# Patient Record
Sex: Female | Born: 1990 | Race: Black or African American | Hispanic: No | Marital: Single | Smoking: Never smoker
Health system: Southern US, Community
[De-identification: ages and names within clinical notes are randomized; demographics above are authoritative.]

## PROBLEM LIST (undated history)

## (undated) ENCOUNTER — Inpatient Hospital Stay (HOSPITAL_COMMUNITY): Payer: Self-pay

## (undated) HISTORY — PX: TONSILLECTOMY: SUR1361

---

## 2012-05-10 ENCOUNTER — Inpatient Hospital Stay (HOSPITAL_COMMUNITY)
Admission: AD | Admit: 2012-05-10 | Discharge: 2012-05-10 | Disposition: A | Discharge status: Home / Self Care | Payer: Medicaid - Out of State | Source: Ambulatory Visit | Attending: Obstetrics & Gynecology | Admitting: Obstetrics & Gynecology

## 2012-05-10 ENCOUNTER — Encounter (HOSPITAL_COMMUNITY): Payer: Self-pay

## 2012-05-10 DIAGNOSIS — R55 Syncope and collapse: Secondary | ICD-10-CM

## 2012-05-10 DIAGNOSIS — O2650 Maternal hypotension syndrome, unspecified trimester: Secondary | ICD-10-CM | POA: Insufficient documentation

## 2012-05-10 LAB — CBC
MCH: 26 pg (ref 26.0–34.0)
MCHC: 32.9 g/dL (ref 30.0–36.0)
RDW: 14 % (ref 11.5–15.5)

## 2012-05-10 LAB — COMPREHENSIVE METABOLIC PANEL
ALT: 9 U/L (ref 0–35)
AST: 24 U/L (ref 0–37)
Alkaline Phosphatase: 102 U/L (ref 39–117)
CO2: 23 mEq/L (ref 19–32)
Chloride: 102 mEq/L (ref 96–112)
GFR calc Af Amer: >90 mL/min (ref >90)
GFR calc non Af Amer: >90 mL/min (ref >90)
Glucose, Bld: 89 mg/dL (ref 70–99)
Potassium: 3.5 mEq/L (ref 3.5–5.1)
Sodium: 133 mEq/L — ABNORMAL LOW (ref 135–145)

## 2012-05-10 NOTE — MAU Provider Note (Signed)
Attestation of Attending Supervision of Advanced Practitioner (CNM/NP): Evaluation and management procedures were performed by the Advanced Practitioner under my supervision and collaboration.  I have reviewed the Advanced Practitioner's note and chart, and I agree with the management and plan.  HARRAWAY-SMITH, Tykel Badie 11:10 PM
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
## 2013-03-15 ENCOUNTER — Encounter (HOSPITAL_COMMUNITY): Payer: Self-pay | Admitting: *Deleted

## 2014-01-15 ENCOUNTER — Encounter (HOSPITAL_COMMUNITY): Payer: Self-pay | Admitting: *Deleted

## 2016-02-01 ENCOUNTER — Encounter (HOSPITAL_COMMUNITY): Payer: Self-pay | Admitting: Emergency Medicine

## 2016-02-01 DIAGNOSIS — S161XXA Strain of muscle, fascia and tendon at neck level, initial encounter: Secondary | ICD-10-CM | POA: Diagnosis not present

## 2016-02-01 DIAGNOSIS — Y939 Activity, unspecified: Secondary | ICD-10-CM | POA: Diagnosis not present

## 2016-02-01 DIAGNOSIS — Y92410 Unspecified street and highway as the place of occurrence of the external cause: Secondary | ICD-10-CM | POA: Diagnosis not present

## 2016-02-01 DIAGNOSIS — S199XXA Unspecified injury of neck, initial encounter: Secondary | ICD-10-CM | POA: Diagnosis present

## 2016-02-01 DIAGNOSIS — Y999 Unspecified external cause status: Secondary | ICD-10-CM | POA: Diagnosis not present
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
## 2016-02-02 ENCOUNTER — Emergency Department (HOSPITAL_COMMUNITY): Payer: No Typology Code available for payment source

## 2016-02-02 ENCOUNTER — Emergency Department (HOSPITAL_COMMUNITY)
Admission: EM | Admit: 2016-02-02 | Discharge: 2016-02-02 | Disposition: A | Discharge status: Home / Self Care | Payer: No Typology Code available for payment source | Attending: Emergency Medicine | Admitting: Emergency Medicine

## 2016-02-02 DIAGNOSIS — S161XXA Strain of muscle, fascia and tendon at neck level, initial encounter: Secondary | ICD-10-CM

## 2016-02-02 DIAGNOSIS — M542 Cervicalgia: Secondary | ICD-10-CM

## 2016-02-02 MED ORDER — IBUPROFEN 800 MG PO TABS
800.0000 mg | ORAL_TABLET | Freq: Once | ORAL | Status: AC
  Administered 2016-02-02: 800 mg via ORAL
  Filled 2016-02-02: qty 1

## 2016-02-02 MED ORDER — IBUPROFEN 800 MG PO TABS: 800.0000 mg | ORAL_TABLET | Freq: Three times a day (TID) | ORAL | 0 refills | Status: AC

## 2016-02-02 MED ORDER — METHOCARBAMOL 500 MG PO TABS: 500.0000 mg | ORAL_TABLET | Freq: Two times a day (BID) | ORAL | 0 refills | Status: AC

## 2016-02-02 NOTE — ED Notes (Signed)
Pt. decided to stay/wait and see the MD.
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
## 2016-02-02 NOTE — Discharge Instructions (Signed)

## 2016-02-02 NOTE — ED Notes (Signed)
Discharge instructions and prescriptions reviewed - voiced understanding 
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
## 2017-04-13 ENCOUNTER — Other Ambulatory Visit: Payer: Self-pay | Admitting: Radiology
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
## 2017-12-15 ENCOUNTER — Encounter (HOSPITAL_BASED_OUTPATIENT_CLINIC_OR_DEPARTMENT_OTHER): Payer: Self-pay

## 2017-12-15 ENCOUNTER — Emergency Department (HOSPITAL_BASED_OUTPATIENT_CLINIC_OR_DEPARTMENT_OTHER)
Admission: EM | Admit: 2017-12-15 | Discharge: 2017-12-15 | Disposition: A | Discharge status: Home / Self Care | Payer: BLUE CROSS/BLUE SHIELD | Attending: Emergency Medicine | Admitting: Emergency Medicine

## 2017-12-15 ENCOUNTER — Other Ambulatory Visit: Payer: Self-pay

## 2017-12-15 DIAGNOSIS — R200 Anesthesia of skin: Secondary | ICD-10-CM | POA: Diagnosis present

## 2017-12-15 DIAGNOSIS — G629 Polyneuropathy, unspecified: Secondary | ICD-10-CM

## 2017-12-15 DIAGNOSIS — G9009 Other idiopathic peripheral autonomic neuropathy: Secondary | ICD-10-CM | POA: Insufficient documentation

## 2017-12-15 MED ORDER — PREDNISONE 20 MG PO TABS: 40.0000 mg | ORAL_TABLET | Freq: Every day | ORAL | 0 refills | Status: AC

## 2017-12-15 NOTE — ED Notes (Signed)
Pt verbalizes understanding of d/c instructions and denies any further needs at this time. 

## 2017-12-15 NOTE — ED Triage Notes (Signed)
Pt states she had decrease function to right hand and numbness to right UE 6 days ago-she was seen at [facility] [place] [facility]-states they ruled out stroke-dx with functional tremor with neuro referral-states sx have persisted-NAD-steady gait

## 2017-12-15 NOTE — Discharge Instructions (Signed)
Follow-up with neurology as previously scheduled. °

## 2017-12-25 NOTE — ED Provider Notes (Signed)
[facility] EMERGENCY DEPARTMENT Provider Note   CSN: [id number] Arrival date & time: 12/15/17  1846     History   Chief Complaint Chief Complaint  Patient presents with  . Arm Problem    HPI Cindy Ruiz is a 27 y.o. female.  HPI   27 year old female with swelling and numbness in her right hand.  Onset about 6 days ago.  She states that she was evaluated about a facility in [place].  She says she was admitted and worked up for possible stroke.  She was told that she needs to follow-up with neurology.  Symptoms have persisted so she is seeking and another evaluation.  History reviewed. No pertinent past medical history.  There are no active problems to display for this patient.   Past Surgical History:  Procedure Laterality Date  . TONSILLECTOMY       OB History    Gravida  1   Para      Term      Preterm      AB      Living        SAB      TAB      Ectopic      Multiple      Live Births               Home Medications    Prior to Admission medications   Medication Sig Start Date End Date Taking? Authorizing Provider  ibuprofen (ADVIL,MOTRIN) 800 MG tablet Take 1 tablet (800 mg total) by mouth 3 (three) times daily. 02/02/16   Muthersbaugh, Dahlia Client, PA-C  methocarbamol (ROBAXIN) 500 MG tablet Take 1 tablet (500 mg total) by mouth 2 (two) times daily. 02/02/16   Muthersbaugh, Dahlia Client, PA-C  predniSONE (DELTASONE) 20 MG tablet Take 2 tablets (40 mg total) by mouth daily. 12/15/17   Raeford Razor, MD    Family History No family history on file.  Social History Social History   Tobacco Use  . Smoking status: Never Smoker  . Smokeless tobacco: Never Used  Substance Use Topics  . Alcohol use: No  . Drug use: No     Allergies   Banana and Erythromycin   Review of Systems Review of Systems  All systems reviewed and negative, other than as noted in HPI.  Physical Exam Updated Vital Signs BP 121/80 (BP Location: Left  Arm)   Pulse 93   Temp 98.2 F (36.8 C) (Oral)   Resp 18   Ht 5\' 2"  (1.575 m)   Wt 87.1 kg   LMP 10/15/2017   SpO2 100%   BMI 35.12 kg/m   Physical Exam  Constitutional: She appears well-developed and well-nourished. No distress.  HENT:  Head: Normocephalic and atraumatic.  Eyes: Conjunctivae are normal. Right eye exhibits no discharge. Left eye exhibits no discharge.  Neck: Neck supple.  Cardiovascular: Normal rate, regular rhythm and normal heart sounds. Exam reveals no gallop and no friction rub.  No murmur heard. Pulmonary/Chest: Effort normal and breath sounds normal. No respiratory distress.  Abdominal: Soft. She exhibits no distension. There is no tenderness.  Musculoskeletal: She exhibits no edema or tenderness.  Neurological: She is alert.  Sensation in median ulnar and radial nerve distributions in the distal right forearm/hand.  She has some function but significant weakness in the same motor distributions.  No swelling.  No concerning skin lesions noted.  Easily palpable radial pulse.  Skin: Skin is warm and dry.  Psychiatric: She has a normal  mood and affect. Her behavior is normal. Thought content normal.  Nursing note and vitals reviewed.    ED Treatments / Results  Labs (all labs ordered are listed, but only abnormal results are displayed) Labs Reviewed - No data to display  EKG None  Radiology No results found.  Procedures Procedures (including critical care time)  Medications Ordered in ED Medications - No data to display   Initial Impression / Assessment and Plan / ED Course  I have reviewed the triage vital signs and the nursing notes.  Pertinent labs & imaging results that were available during my care of the patient were reviewed by me and considered in my medical decision making (see chart for details).     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  She is Artie been worked up  from a stroke standpoint.  I do not think that this is a central etiology based on her symptoms though.  Symptoms have been stable for almost a week now.  She is to follow-up with neurology.  Final Clinical Impressions(s) / ED Diagnoses   Final diagnoses:  Neuropathy    ED Discharge Orders         Ordered    predniSONE (DELTASONE) 20 MG tablet  Daily     12/15/17 2045           Raeford Razor, MD 12/25/17 0003

## 2018-09-24 ENCOUNTER — Other Ambulatory Visit: Payer: Self-pay

## 2018-09-24 ENCOUNTER — Emergency Department (HOSPITAL_COMMUNITY)
Admission: EM | Admit: 2018-09-24 | Discharge: 2018-09-24 | Disposition: A | Discharge status: Home / Self Care | Payer: Medicaid - Out of State | Attending: Emergency Medicine | Admitting: Emergency Medicine

## 2018-09-24 ENCOUNTER — Encounter (HOSPITAL_COMMUNITY): Payer: Self-pay | Admitting: Emergency Medicine

## 2018-09-24 DIAGNOSIS — Z881 Allergy status to other antibiotic agents status: Secondary | ICD-10-CM | POA: Insufficient documentation

## 2018-09-24 DIAGNOSIS — Z7952 Long term (current) use of systemic steroids: Secondary | ICD-10-CM | POA: Insufficient documentation

## 2018-09-24 DIAGNOSIS — R102 Pelvic and perineal pain: Secondary | ICD-10-CM | POA: Diagnosis not present

## 2018-09-24 DIAGNOSIS — R350 Frequency of micturition: Secondary | ICD-10-CM | POA: Insufficient documentation

## 2018-09-24 DIAGNOSIS — R300 Dysuria: Secondary | ICD-10-CM | POA: Diagnosis present

## 2018-09-24 DIAGNOSIS — Z79899 Other long term (current) drug therapy: Secondary | ICD-10-CM | POA: Insufficient documentation

## 2018-09-24 DIAGNOSIS — Z91018 Allergy to other foods: Secondary | ICD-10-CM | POA: Diagnosis not present

## 2018-09-24 DIAGNOSIS — R1030 Lower abdominal pain, unspecified: Secondary | ICD-10-CM

## 2018-09-24 LAB — COMPREHENSIVE METABOLIC PANEL
ALT: 13 U/L (ref 0–44)
AST: 22 U/L (ref 15–41)
Albumin: 3.6 g/dL (ref 3.5–5.0)
Alkaline Phosphatase: 53 U/L (ref 38–126)
Anion gap: 8 (ref 5–15)
BUN: 10 mg/dL (ref 6–20)
CO2: 24 mmol/L (ref 22–32)
Calcium: 9 mg/dL (ref 8.9–10.3)
Chloride: 103 mmol/L (ref 98–111)
Creatinine, Ser: 0.93 mg/dL (ref 0.44–1.00)
GFR calc Af Amer: >60 mL/min (ref >60)
GFR calc non Af Amer: >60 mL/min (ref >60)
Glucose, Bld: 87 mg/dL (ref 70–99)
Potassium: 3.6 mmol/L (ref 3.5–5.1)
Sodium: 135 mmol/L (ref 135–145)
Total Bilirubin: 0.4 mg/dL (ref 0.3–1.2)
Total Protein: 7.2 g/dL (ref 6.5–8.1)

## 2018-09-24 LAB — URINALYSIS, ROUTINE W REFLEX MICROSCOPIC
Bilirubin Urine: NEGATIVE
Glucose, UA: NEGATIVE mg/dL
Hgb urine dipstick: NEGATIVE
Ketones, ur: NEGATIVE mg/dL
Leukocytes,Ua: NEGATIVE
Nitrite: NEGATIVE
Protein, ur: NEGATIVE mg/dL
Specific Gravity, Urine: 1.027 (ref 1.005–1.030)
pH: 6 (ref 5.0–8.0)

## 2018-09-24 LAB — CBC
HCT: 39.2 % (ref 36.0–46.0)
Hemoglobin: 12.1 g/dL (ref 12.0–15.0)
MCH: 23.8 pg — ABNORMAL LOW (ref 26.0–34.0)
MCHC: 30.9 g/dL (ref 30.0–36.0)
MCV: 77.2 fL — ABNORMAL LOW (ref 80.0–100.0)
Platelets: 277 10*3/uL (ref 150–400)
RBC: 5.08 MIL/uL (ref 3.87–5.11)
RDW: 16 % — ABNORMAL HIGH (ref 11.5–15.5)
WBC: 10.7 10*3/uL — ABNORMAL HIGH (ref 4.0–10.5)
nRBC: 0 % (ref 0.0–0.2)

## 2018-09-24 LAB — I-STAT BETA HCG BLOOD, ED (MC, WL, AP ONLY): I-stat hCG, quantitative: <5 m[IU]/mL (ref <5)

## 2018-09-24 LAB — LIPASE, BLOOD: Lipase: 32 U/L (ref 11–51)

## 2018-09-24 MED ORDER — SODIUM CHLORIDE 0.9% FLUSH: 3.0000 mL | Freq: Once | INTRAVENOUS | Status: DC

## 2018-09-24 MED ORDER — CEPHALEXIN 500 MG PO CAPS: 500.0000 mg | ORAL_CAPSULE | Freq: Two times a day (BID) | ORAL | 0 refills | Status: AC

## 2018-09-24 MED ORDER — CEPHALEXIN 250 MG PO CAPS
500.0000 mg | ORAL_CAPSULE | Freq: Once | ORAL | Status: AC
  Administered 2018-09-24: 500 mg via ORAL
  Filled 2018-09-24: qty 2

## 2018-09-24 NOTE — ED Triage Notes (Signed)
Pt reports intermittent abdominal pain x 2-3 days. Pt denies nausea, vomiting, diarrhea or vaginal discharge. Pt reports her period is 3 months late, she reports she has irregular periods. Pt reports she has had her tubes tied.

## 2018-09-24 NOTE — ED Provider Notes (Signed)
[facility]   CSN: [id number] Arrival date & time: 09/24/18  0042     History   Chief Complaint Chief Complaint  Patient presents with  . Abdominal Pain    HPI Cindy Ruiz is a 28 y.o. female.     Patient presents to the emergency department with a chief complaint dysuria and abdominal pain.  She states that the abdominal pain started about 2 days ago, and the dysuria and urinary frequency started yesterday.  She denies having any visible hematuria.  Denies any flank pain.  She denies any fevers, chills, nausea, or vomiting.  Denies taking anything for symptoms.  Denies any other associated symptoms  The history is provided by the patient. No language interpreter was used.    History reviewed. No pertinent past medical history.  There are no active problems to display for this patient.   Past Surgical History:  Procedure Laterality Date  . TONSILLECTOMY       OB History    Gravida  1   Para      Term      Preterm      AB      Living        SAB      TAB      Ectopic      Multiple      Live Births               Home Medications    Prior to Admission medications   Medication Sig Start Date End Date Taking? Authorizing Provider  ibuprofen (ADVIL,MOTRIN) 800 MG tablet Take 1 tablet (800 mg total) by mouth 3 (three) times daily. 02/02/16   Muthersbaugh, Dahlia ClientHannah, PA-C  methocarbamol (ROBAXIN) 500 MG tablet Take 1 tablet (500 mg total) by mouth 2 (two) times daily. 02/02/16   Muthersbaugh, Dahlia ClientHannah, PA-C  predniSONE (DELTASONE) 20 MG tablet Take 2 tablets (40 mg total) by mouth daily. 12/15/17   Raeford RazorKohut, Stephen, MD    Family History History reviewed. No pertinent family history.  Social History Social History   Tobacco Use  . Smoking status: Never Smoker  . Smokeless tobacco: Never Used  Substance Use Topics  . Alcohol use: No  . Drug use: No     Allergies   Banana and Erythromycin    Review of Systems Review of Systems  All other systems reviewed and are negative.    Physical Exam Updated Vital Signs BP 120/80   Pulse 79   Temp 97.9 F (36.6 C) (Oral)   Resp 16   SpO2 100%   Physical Exam Vitals signs and nursing note reviewed.  Constitutional:      General: She is not in acute distress.    Appearance: She is well-developed.  HENT:     Head: Normocephalic and atraumatic.  Eyes:     Conjunctiva/sclera: Conjunctivae normal.  Neck:     Musculoskeletal: Neck supple.  Cardiovascular:     Rate and Rhythm: Normal rate and regular rhythm.     Heart sounds: No murmur.  Pulmonary:     Effort: Pulmonary effort is normal. No respiratory distress.     Breath sounds: Normal breath sounds.  Abdominal:     Palpations: Abdomen is soft.     Tenderness: There is abdominal tenderness.     Comments: Mild suprapubic abdominal tenderness  Musculoskeletal: Normal range of motion.  Skin:    General: Skin is warm and dry.  Neurological:  Mental Status: She is alert and oriented to person, place, and time.  Psychiatric:        Mood and Affect: Mood normal.        Behavior: Behavior normal.        Thought Content: Thought content normal.        Judgment: Judgment normal.      ED Treatments / Results  Labs (all labs ordered are listed, but only abnormal results are displayed) Labs Reviewed  CBC - Abnormal; Notable for the following components:      Result Value   WBC 10.7 (*)    MCV 77.2 (*)    MCH 23.8 (*)    RDW 16.0 (*)    All other components within normal limits  LIPASE, BLOOD  COMPREHENSIVE METABOLIC PANEL  URINALYSIS, ROUTINE W REFLEX MICROSCOPIC  I-STAT BETA HCG BLOOD, ED ([facility], [facility], [facility] ONLY)    EKG None  Radiology No results found.  Procedures Procedures (including critical care time)  Medications Ordered in ED Medications  sodium chloride flush (NS) 0.9 % injection 3 mL (has no administration in time range)  cephALEXin (KEFLEX)  capsule 500 mg (has no administration in time range)     Initial Impression / Assessment and Plan / ED Course  I have reviewed the triage vital signs and the nursing notes.  Pertinent labs & imaging results that were available during my care of the patient were reviewed by me and considered in my medical decision making (see chart for details).        Patient with suprapubic abdominal pain, dysuria, and urinary frequency.  Symptoms are consistent with UTI, however urinalysis is unremarkable.  Will send urine for culture.  Will start patient on antibiotics based on symptoms.  She does not have focal tenderness in the right lower quadrant, I doubt appendicitis, however we did discuss in detail that if the patient's symptoms worsen or she develops focal/localizing pain, she will need to return to have a CAT scan done.  Patient understands and agrees with the plan.  Final Clinical Impressions(s) / ED Diagnoses   Final diagnoses:  Lower abdominal pain  Dysuria    ED Discharge Orders         Ordered    cephALEXin (KEFLEX) 500 MG capsule  2 times daily     09/24/18 0408           Montine Circle, PA-C 09/24/18 0409    Ezequiel Essex, MD 09/24/18 [phone]

## 2018-09-24 NOTE — ED Notes (Signed)
Patient verbalizes understanding of discharge instructions. Opportunity for questioning and answers were provided. Armband removed by staff, pt discharged from ED ambulatory.   

## 2018-09-25 LAB — URINE CULTURE: Culture: 20000 — AB

## 2020-09-18 ENCOUNTER — Other Ambulatory Visit: Payer: Self-pay | Admitting: Radiology
